# Patient Record
Sex: Female | Born: 1970 | Marital: Married | Smoking: Former smoker
Health system: Southern US, Community
[De-identification: ages and names within clinical notes are randomized; demographics above are authoritative.]

## PROBLEM LIST (undated history)

## (undated) DIAGNOSIS — E559 Vitamin D deficiency, unspecified: Secondary | ICD-10-CM

## (undated) DIAGNOSIS — R569 Unspecified convulsions: Secondary | ICD-10-CM

## (undated) DIAGNOSIS — L989 Disorder of the skin and subcutaneous tissue, unspecified: Secondary | ICD-10-CM

## (undated) DIAGNOSIS — N750 Cyst of Bartholin's gland: Secondary | ICD-10-CM

## (undated) DIAGNOSIS — L259 Unspecified contact dermatitis, unspecified cause: Secondary | ICD-10-CM

## (undated) DIAGNOSIS — Z309 Encounter for contraceptive management, unspecified: Secondary | ICD-10-CM

## (undated) HISTORY — DX: Unspecified convulsions: R56.9

## (undated) HISTORY — DX: Unspecified contact dermatitis, unspecified cause: L25.9

## (undated) HISTORY — DX: Disorder of the skin and subcutaneous tissue, unspecified: L98.9

## (undated) HISTORY — DX: Cyst of Bartholin's gland: N75.0

## (undated) HISTORY — PX: BREAST ENHANCEMENT SURGERY: SHX7

## (undated) HISTORY — DX: Vitamin D deficiency, unspecified: E55.9

## (undated) HISTORY — DX: Encounter for contraceptive management, unspecified: Z30.9

## (undated) HISTORY — PX: INTRAUTERINE DEVICE INSERTION: SHX323

---

## 2012-09-19 ENCOUNTER — Encounter: Payer: Self-pay | Admitting: Family Medicine

## 2012-09-19 ENCOUNTER — Ambulatory Visit (INDEPENDENT_AMBULATORY_CARE_PROVIDER_SITE_OTHER): Payer: 59 | Admitting: Family Medicine

## 2012-09-19 VITALS — BP 115/79 | HR 75 | Temp 98.0°F | Ht 69.0 in | Wt 130.8 lb

## 2012-09-19 DIAGNOSIS — Z0000 Encounter for general adult medical examination without abnormal findings: Secondary | ICD-10-CM

## 2012-09-19 DIAGNOSIS — Z309 Encounter for contraceptive management, unspecified: Secondary | ICD-10-CM

## 2012-09-19 DIAGNOSIS — E559 Vitamin D deficiency, unspecified: Secondary | ICD-10-CM | POA: Insufficient documentation

## 2012-09-19 DIAGNOSIS — Z23 Encounter for immunization: Secondary | ICD-10-CM

## 2012-09-19 DIAGNOSIS — L578 Other skin changes due to chronic exposure to nonionizing radiation: Secondary | ICD-10-CM

## 2012-09-19 DIAGNOSIS — R569 Unspecified convulsions: Secondary | ICD-10-CM

## 2012-09-19 DIAGNOSIS — L989 Disorder of the skin and subcutaneous tissue, unspecified: Secondary | ICD-10-CM

## 2012-09-19 HISTORY — DX: Disorder of the skin and subcutaneous tissue, unspecified: L98.9

## 2012-09-19 HISTORY — DX: Vitamin D deficiency, unspecified: E55.9

## 2012-09-19 HISTORY — DX: Encounter for contraceptive management, unspecified: Z30.9

## 2012-09-19 LAB — RENAL FUNCTION PANEL
Albumin: 3.9 g/dL (ref 3.5–5.2)
Chloride: 102 mEq/L (ref 96–112)
Phosphorus: 3.9 mg/dL (ref 2.3–4.6)
Potassium: 4.3 mEq/L (ref 3.5–5.1)

## 2012-09-19 NOTE — Assessment & Plan Note (Signed)
Have checked a level today. She takes a ca/vit d tab daily and a Vitamin D 1000IU cap daily as well.

## 2012-09-19 NOTE — Patient Instructions (Addendum)
Benign Rolandic Seizure Disorder   Preventive Care for Adults, Female A healthy lifestyle and preventive care can promote health and wellness. Preventive health guidelines for women include the following key practices.  A routine yearly physical is a good way to check with your caregiver about your health and preventive screening. It is a chance to share any concerns and updates on your health, and to receive a thorough exam.  Visit your dentist for a routine exam and preventive care every 6 months. Brush your teeth twice a day and floss once a day. Good oral hygiene prevents tooth decay and gum disease.  The frequency of eye exams is based on your age, health, family medical history, use of contact lenses, and other factors. Follow your caregiver's recommendations for frequency of eye exams.  Eat a healthy diet. Foods like vegetables, fruits, whole grains, low-fat dairy products, and lean protein foods contain the nutrients you need without too many calories. Decrease your intake of foods high in solid fats, added sugars, and salt. Eat the right amount of calories for you.Get information about a proper diet from your caregiver, if necessary.  Regular physical exercise is one of the most important things you can do for your health. Most adults should get at least 150 minutes of moderate-intensity exercise (any activity that increases your heart rate and causes you to sweat) each week. In addition, most adults need muscle-strengthening exercises on 2 or more days a week.  Maintain a healthy weight. The body mass index (BMI) is a screening tool to identify possible weight problems. It provides an estimate of body fat based on height and weight. Your caregiver can help determine your BMI, and can help you achieve or maintain a healthy weight.For adults 20 years and older:  A BMI below 18.5 is considered underweight.  A BMI of 18.5 to 24.9 is normal.  A BMI of 25 to 29.9 is considered  overweight.  A BMI of 30 and above is considered obese.  Maintain normal blood lipids and cholesterol levels by exercising and minimizing your intake of saturated fat. Eat a balanced diet with plenty of fruit and vegetables. Blood tests for lipids and cholesterol should begin at age 73 and be repeated every 5 years. If your lipid or cholesterol levels are high, you are over 50, or you are at high risk for heart disease, you may need your cholesterol levels checked more frequently.Ongoing high lipid and cholesterol levels should be treated with medicines if diet and exercise are not effective.  If you smoke, find out from your caregiver how to quit. If you do not use tobacco, do not start.  If you are pregnant, do not drink alcohol. If you are breastfeeding, be very cautious about drinking alcohol. If you are not pregnant and choose to drink alcohol, do not exceed 1 drink per day. One drink is considered to be 12 ounces (355 mL) of beer, 5 ounces (148 mL) of wine, or 1.5 ounces (44 mL) of liquor.  Avoid use of street drugs. Do not share needles with anyone. Ask for help if you need support or instructions about stopping the use of drugs.  High blood pressure causes heart disease and increases the risk of stroke. Your blood pressure should be checked at least every 1 to 2 years. Ongoing high blood pressure should be treated with medicines if weight loss and exercise are not effective.  If you are 28 to 39 years old, ask your caregiver if you should take  aspirin to prevent strokes.  Diabetes screening involves taking a blood sample to check your fasting blood sugar level. This should be done once every 3 years, after age 73, if you are within normal weight and without risk factors for diabetes. Testing should be considered at a younger age or be carried out more frequently if you are overweight and have at least 1 risk factor for diabetes.  Breast cancer screening is essential preventive care for  women. You should practice "breast self-awareness." This means understanding the normal appearance and feel of your breasts and may include breast self-examination. Any changes detected, no matter how small, should be reported to a caregiver. Women in their 4s and 30s should have a clinical breast exam (CBE) by a caregiver as part of a regular health exam every 1 to 3 years. After age 45, women should have a CBE every year. Starting at age 31, women should consider having a mammography (breast X-ray test) every year. Women who have a family history of breast cancer should talk to their caregiver about genetic screening. Women at a high risk of breast cancer should talk to their caregivers about having magnetic resonance imaging (MRI) and a mammography every year.  The Pap test is a screening test for cervical cancer. A Pap test can show cell changes on the cervix that might become cervical cancer if left untreated. A Pap test is a procedure in which cells are obtained and examined from the lower end of the uterus (cervix).  Women should have a Pap test starting at age 60.  Between ages 14 and 51, Pap tests should be repeated every 2 years.  Beginning at age 72, you should have a Pap test every 3 years as long as the past 3 Pap tests have been normal.  Some women have medical problems that increase the chance of getting cervical cancer. Talk to your caregiver about these problems. It is especially important to talk to your caregiver if a new problem develops soon after your last Pap test. In these cases, your caregiver may recommend more frequent screening and Pap tests.  The above recommendations are the same for women who have or have not gotten the vaccine for human papillomavirus (HPV).  If you had a hysterectomy for a problem that was not cancer or a condition that could lead to cancer, then you no longer need Pap tests. Even if you no longer need a Pap test, a regular exam is a good idea to make  sure no other problems are starting.  If you are between ages 50 and 23, and you have had normal Pap tests going back 10 years, you no longer need Pap tests. Even if you no longer need a Pap test, a regular exam is a good idea to make sure no other problems are starting.  If you have had past treatment for cervical cancer or a condition that could lead to cancer, you need Pap tests and screening for cancer for at least 20 years after your treatment.  If Pap tests have been discontinued, risk factors (such as a new sexual partner) need to be reassessed to determine if screening should be resumed.  The HPV test is an additional test that may be used for cervical cancer screening. The HPV test looks for the virus that can cause the cell changes on the cervix. The cells collected during the Pap test can be tested for HPV. The HPV test could be used to screen women aged  30 years and older, and should be used in women of any age who have unclear Pap test results. After the age of 46, women should have HPV testing at the same frequency as a Pap test.  Colorectal cancer can be detected and often prevented. Most routine colorectal cancer screening begins at the age of 48 and continues through age 38. However, your caregiver may recommend screening at an earlier age if you have risk factors for colon cancer. On a yearly basis, your caregiver may provide home test kits to check for hidden blood in the stool. Use of a small camera at the end of a tube, to directly examine the colon (sigmoidoscopy or colonoscopy), can detect the earliest forms of colorectal cancer. Talk to your caregiver about this at age 38, when routine screening begins. Direct examination of the colon should be repeated every 5 to 10 years through age 6, unless early forms of pre-cancerous polyps or small growths are found.  Hepatitis C blood testing is recommended for all people born from [birth year] through 1965 and any individual with known risks  for hepatitis C.  Practice safe sex. Use condoms and avoid high-risk sexual practices to reduce the spread of sexually transmitted infections (STIs). STIs include gonorrhea, chlamydia, syphilis, trichomonas, herpes, HPV, and human immunodeficiency virus (HIV). Herpes, HIV, and HPV are viral illnesses that have no cure. They can result in disability, cancer, and death. Sexually active women aged 27 and younger should be checked for chlamydia. Older women with new or multiple partners should also be tested for chlamydia. Testing for other STIs is recommended if you are sexually active and at increased risk.  Osteoporosis is a disease in which the bones lose minerals and strength with aging. This can result in serious bone fractures. The risk of osteoporosis can be identified using a bone density scan. Women ages 50 and over and women at risk for fractures or osteoporosis should discuss screening with their caregivers. Ask your caregiver whether you should take a calcium supplement or vitamin D to reduce the rate of osteoporosis.  Menopause can be associated with physical symptoms and risks. Hormone replacement therapy is available to decrease symptoms and risks. You should talk to your caregiver about whether hormone replacement therapy is right for you.  Use sunscreen with sun protection factor (SPF) of 30 or more. Apply sunscreen liberally and repeatedly throughout the day. You should seek shade when your shadow is shorter than you. Protect yourself by wearing long sleeves, pants, a wide-brimmed hat, and sunglasses year round, whenever you are outdoors.  Once a month, do a whole body skin exam, using a mirror to look at the skin on your back. Notify your caregiver of new moles, moles that have irregular borders, moles that are larger than a pencil eraser, or moles that have changed in shape or color.  Stay current with required immunizations.  Influenza. You need a dose every fall (or winter). The  composition of the flu vaccine changes each year, so being vaccinated once is not enough.  Pneumococcal polysaccharide. You need 1 to 2 doses if you smoke cigarettes or if you have certain chronic medical conditions. You need 1 dose at age 67 (or older) if you have never been vaccinated.  Tetanus, diphtheria, pertussis (Tdap, Td). Get 1 dose of Tdap vaccine if you are younger than age 83, are over 60 and have contact with an infant, are a Research scientist (physical sciences), are pregnant, or simply want to be protected from whooping cough.  After that, you need a Td booster dose every 10 years. Consult your caregiver if you have not had at least 3 tetanus and diphtheria-containing shots sometime in your life or have a deep or dirty wound.  HPV. You need this vaccine if you are a woman age 42 or younger. The vaccine is given in 3 doses over 6 months.  Measles, mumps, rubella (MMR). You need at least 1 dose of MMR if you were born in 1957 or later. You may also need a second dose.  Meningococcal. If you are age 64 to 8 and a first-year college student living in a residence hall, or have one of several medical conditions, you need to get vaccinated against meningococcal disease. You may also need additional booster doses.  Zoster (shingles). If you are age 66 or older, you should get this vaccine.  Varicella (chickenpox). If you have never had chickenpox or you were vaccinated but received only 1 dose, talk to your caregiver to find out if you need this vaccine.  Hepatitis A. You need this vaccine if you have a specific risk factor for hepatitis A virus infection or you simply wish to be protected from this disease. The vaccine is usually given as 2 doses, 6 to 18 months apart.  Hepatitis B. You need this vaccine if you have a specific risk factor for hepatitis B virus infection or you simply wish to be protected from this disease. The vaccine is given in 3 doses, usually over 6 months. Preventive Services /  Frequency Ages 1 to 34  Blood pressure check.** / Every 1 to 2 years.  Lipid and cholesterol check.** / Every 5 years beginning at age 75.  Clinical breast exam.** / Every 3 years for women in their 56s and 30s.  Pap test.** / Every 2 years from ages 77 through 49. Every 3 years starting at age 90 through age 5 or 69 with a history of 3 consecutive normal Pap tests.  HPV screening.** / Every 3 years from ages 81 through ages 59 to 43 with a history of 3 consecutive normal Pap tests.  Hepatitis C blood test.** / For any individual with known risks for hepatitis C.  Skin self-exam. / Monthly.  Influenza immunization.** / Every year.  Pneumococcal polysaccharide immunization.** / 1 to 2 doses if you smoke cigarettes or if you have certain chronic medical conditions.  Tetanus, diphtheria, pertussis (Tdap, Td) immunization. / A one-time dose of Tdap vaccine. After that, you need a Td booster dose every 10 years.  HPV immunization. / 3 doses over 6 months, if you are 38 and younger.  Measles, mumps, rubella (MMR) immunization. / You need at least 1 dose of MMR if you were born in 1957 or later. You may also need a second dose.  Meningococcal immunization. / 1 dose if you are age 49 to 73 and a first-year college student living in a residence hall, or have one of several medical conditions, you need to get vaccinated against meningococcal disease. You may also need additional booster doses.  Varicella immunization.** / Consult your caregiver.  Hepatitis A immunization.** / Consult your caregiver. 2 doses, 6 to 18 months apart.  Hepatitis B immunization.** / Consult your caregiver. 3 doses usually over 6 months. Ages 45 to 56  Blood pressure check.** / Every 1 to 2 years.  Lipid and cholesterol check.** / Every 5 years beginning at age 58.  Clinical breast exam.** / Every year after age 16.  Mammogram.** /  Every year beginning at age 43 and continuing for as long as you are in  good health. Consult with your caregiver.  Pap test.** / Every 3 years starting at age 44 through age 61 or 25 with a history of 3 consecutive normal Pap tests.  HPV screening.** / Every 3 years from ages 58 through ages 78 to 73 with a history of 3 consecutive normal Pap tests.  Fecal occult blood test (FOBT) of stool. / Every year beginning at age 19 and continuing until age 78. You may not need to do this test if you get a colonoscopy every 10 years.  Flexible sigmoidoscopy or colonoscopy.** / Every 5 years for a flexible sigmoidoscopy or every 10 years for a colonoscopy beginning at age 43 and continuing until age 68.  Hepatitis C blood test.** / For all people born from [birth year] through 1965 and any individual with known risks for hepatitis C.  Skin self-exam. / Monthly.  Influenza immunization.** / Every year.  Pneumococcal polysaccharide immunization.** / 1 to 2 doses if you smoke cigarettes or if you have certain chronic medical conditions.  Tetanus, diphtheria, pertussis (Tdap, Td) immunization.** / A one-time dose of Tdap vaccine. After that, you need a Td booster dose every 10 years.  Measles, mumps, rubella (MMR) immunization. / You need at least 1 dose of MMR if you were born in 1957 or later. You may also need a second dose.  Varicella immunization.** / Consult your caregiver.  Meningococcal immunization.** / Consult your caregiver.  Hepatitis A immunization.** / Consult your caregiver. 2 doses, 6 to 18 months apart.  Hepatitis B immunization.** / Consult your caregiver. 3 doses, usually over 6 months. Ages 79 and over  Blood pressure check.** / Every 1 to 2 years.  Lipid and cholesterol check.** / Every 5 years beginning at age 49.  Clinical breast exam.** / Every year after age 24.  Mammogram.** / Every year beginning at age 38 and continuing for as long as you are in good health. Consult with your caregiver.  Pap test.** / Every 3 years starting at age 57 through  age 42 or 73 with a 3 consecutive normal Pap tests. Testing can be stopped between 65 and 70 with 3 consecutive normal Pap tests and no abnormal Pap or HPV tests in the past 10 years.  HPV screening.** / Every 3 years from ages 22 through ages 66 or 29 with a history of 3 consecutive normal Pap tests. Testing can be stopped between 65 and 70 with 3 consecutive normal Pap tests and no abnormal Pap or HPV tests in the past 10 years.  Fecal occult blood test (FOBT) of stool. / Every year beginning at age 20 and continuing until age 56. You may not need to do this test if you get a colonoscopy every 10 years.  Flexible sigmoidoscopy or colonoscopy.** / Every 5 years for a flexible sigmoidoscopy or every 10 years for a colonoscopy beginning at age 60 and continuing until age 45.  Hepatitis C blood test.** / For all people born from [birth year] through 1965 and any individual with known risks for hepatitis C.  Osteoporosis screening.** / A one-time screening for women ages 35 and over and women at risk for fractures or osteoporosis.  Skin self-exam. / Monthly.  Influenza immunization.** / Every year.  Pneumococcal polysaccharide immunization.** / 1 dose at age 37 (or older) if you have never been vaccinated.  Tetanus, diphtheria, pertussis (Tdap, Td) immunization. / A one-time dose of  Tdap vaccine if you are over 65 and have contact with an infant, are a Research scientist (physical sciences), or simply want to be protected from whooping cough. After that, you need a Td booster dose every 10 years.  Varicella immunization.** / Consult your caregiver.  Meningococcal immunization.** / Consult your caregiver.  Hepatitis A immunization.** / Consult your caregiver. 2 doses, 6 to 18 months apart.  Hepatitis B immunization.** / Check with your caregiver. 3 doses, usually over 6 months. ** Family history and personal history of risk and conditions may change your caregiver's recommendations. Document Released: 01/16/2002 Document  Revised: 02/12/2012 Document Reviewed: 04/17/2011 [employer] Patient Information 2013 [place], [place].

## 2012-09-19 NOTE — Progress Notes (Signed)
Patient ID: Sharon Rojas, female   DOB: [birth year], 41 y.o.   MRN: [id number] Sharon Rojas [id number] 03-02-1971 09/19/2012      Progress Note New Patient  Subjective  Chief Complaint  Chief Complaint  Patient presents with  . Establish Care    new patient    HPI  41 year old Caucasian female who is in today for her new patient appointment. She's recently moved here from [place] for her husband stopped. She is generally in good health and just needs to establish care. She does have a history of a seizure disorder which started at age 74. He started not currently and she takes several medications which make him infrequent. She is otherwise generally in good health. She notes some mild URI symptoms for last day or 2 for which she took some Alka-Seltzer and was able to attain some improvement. She has some mild nasal congestion but she denies fevers or chills. No ear pain or throat pain , cough, chest pain, palpitations, shortness of breath, GI or GU complaints.  Past Medical History  Diagnosis Date  . Seizures 41 yrs old  . Chicken pox as a child  . Preventative health care 09/19/2012  . Contraceptive management 09/19/2012  . Vitamin D deficiency 09/19/2012  . Skin lesion of cheek 09/19/2012    Past Surgical History  Procedure Date  . Breast surgery 41 yrs old    breast implants    Family History  Problem Relation Age of Onset  . Heart attack Maternal Grandfather   . Heart disease Maternal Grandfather   . Asthma Paternal Grandmother   . Arthritis Paternal Grandmother   . Cancer Paternal Grandfather     lung- smoked  . Alcohol abuse Paternal Grandfather   . Alcohol abuse Father     History   Social History  . Marital Status: Married    Spouse Name: N/A    Number of Children: N/A  . Years of Education: N/A   Occupational History  . Not on file.   Social History Main Topics  . Smoking status: Former Smoker -- 5 years    Types: Cigarettes    Start date:  12/04/1997  . Smokeless tobacco: Never Used  . Alcohol Use: Yes     occasionally  . Drug Use: No  . Sexually Active: Yes -- Female partner(s)   Other Topics Concern  . Not on file   Social History Narrative  . No narrative on file    Current Outpatient Prescriptions on File Prior to Visit  Medication Sig Dispense Refill  . carbamazepine (CARBATROL) 300 MG 12 hr capsule Take 300 mg by mouth as directed. Take 1 capsule in the morning and 2 capsules at night      . clonazePAM (KLONOPIN) 1 MG tablet Take 1 mg by mouth at bedtime.      Marland Kitchen lacosamide (VIMPAT) 200 MG TABS Take 200 mg by mouth 2 (two) times daily.      Marland Kitchen levETIRAcetam (KEPPRA) 750 MG tablet Take 1,500 mg by mouth 2 (two) times daily.      Marland Kitchen levonorgestrel (MIRENA) 20 MCG/24HR IUD 1 each by Intrauterine route once.        No Known Allergies  Review of Systems  Review of Systems  Constitutional: Negative for fever, chills and malaise/fatigue.  HENT: Positive for congestion. Negative for hearing loss and nosebleeds.   Eyes: Negative for discharge.  Respiratory: Negative for cough, sputum production, shortness of breath and wheezing.   Cardiovascular: Negative for chest  pain, palpitations and leg swelling.  Gastrointestinal: Negative for heartburn, nausea, vomiting, abdominal pain, diarrhea, constipation and blood in stool.  Genitourinary: Negative for dysuria, urgency, frequency and hematuria.  Musculoskeletal: Negative for myalgias, back pain and falls.  Skin: Negative for rash.  Neurological: Negative for dizziness, tremors, sensory change, focal weakness, loss of consciousness, weakness and headaches.  Endo/Heme/Allergies: Negative for polydipsia. Does not bruise/bleed easily.  Psychiatric/Behavioral: Negative for depression and suicidal ideas. The patient is not nervous/anxious and does not have insomnia.     Objective  BP 115/79  Pulse 75  Temp 98 F (36.7 C) (Temporal)  Ht 5\' 9"  (1.753 m)  Wt 130 lb 12.8 oz  (59.33 kg)  BMI 19.32 kg/m2  SpO2 99%  Physical Exam  Physical Exam  Constitutional: She is oriented to person, place, and time and well-developed, well-nourished, and in no distress. No distress.  HENT:  Head: Normocephalic and atraumatic.  Right Ear: External ear normal.  Left Ear: External ear normal.  Nose: Nose normal.  Mouth/Throat: Oropharynx is clear and moist. No oropharyngeal exudate.  Eyes: Conjunctivae normal are normal. Pupils are equal, round, and reactive to light. Right eye exhibits no discharge. Left eye exhibits no discharge. No scleral icterus.  Neck: Normal range of motion. Neck supple. No thyromegaly present.  Cardiovascular: Normal rate, regular rhythm, normal heart sounds and intact distal pulses.   No murmur heard. Pulmonary/Chest: Effort normal and breath sounds normal. No respiratory distress. She has no wheezes. She has no rales.  Abdominal: Soft. Bowel sounds are normal. She exhibits no distension and no mass. There is no tenderness.  Musculoskeletal: Normal range of motion. She exhibits no edema and no tenderness.  Lymphadenopathy:    She has no cervical adenopathy.  Neurological: She is alert and oriented to person, place, and time. She has normal reflexes. No cranial nerve deficit. Coordination normal.  Skin: Skin is warm and dry. No rash noted. She is not diaphoretic. There is erythema.       Diffuse moles of different sizes throughout. 1 cm raised circular, erythematous lesion on right cheek  Psychiatric: Mood, memory and affect normal.       Assessment & Plan  Seizures Started having seizures at age 32, has them late night or early morning. Has them infrequently. 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 Will continue to follow with her [place] neurologist for now since she is stable and she goes back to [place] twice annually still  Vitamin D deficiency Have checked a level today. She takes a ca/vit d tab daily and a Vitamin D 1000IU cap daily as  well.   Skin lesion of cheek Has had it for years but lately it as become increasingly irritated and inflamed. Will refer to dermatology for further evaluation. She has a history of significant sun exposure and moles.   Contraceptive management Has Mirena IUD in place has had it 4 years now.  Preventative health care Will request old records and have her return annually for follow up, will review labs done previously by her Neurologist before ordering any further labs.

## 2012-09-19 NOTE — Assessment & Plan Note (Signed)
Started having seizures at age 41, has them late night or early morning. Has them infrequently. 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 Will continue to follow with her [place] neurologist for now since she is stable and she goes back to [place] twice annually still

## 2012-09-19 NOTE — Assessment & Plan Note (Signed)
Has had it for years but lately it as become increasingly irritated and inflamed. Will refer to dermatology for further evaluation. She has a history of significant sun exposure and moles.

## 2012-09-19 NOTE — Assessment & Plan Note (Signed)
Will request old records and have her return annually for follow up, will review labs done previously by her Neurologist before ordering any further labs.

## 2012-09-19 NOTE — Assessment & Plan Note (Signed)
Has Mirena IUD in place has had it 4 years now.

## 2012-09-23 LAB — VITAMIN D 1,25 DIHYDROXY: Vitamin D3 1, 25 (OH)2: 66 pg/mL

## 2012-12-31 ENCOUNTER — Telehealth: Payer: Self-pay | Admitting: Family Medicine

## 2012-12-31 NOTE — Telephone Encounter (Signed)
So my daughter has Benign Rolandic Seizure Disorder. [place] was in [place] so my neurologist is too far away. I do not remember who her daughter sees but I think there is a pediatric neurologist in [place] everyone likes, Dr Benna Dunks or Sharene Skeans (can check with steph or Dr Reece Agar) and see if they recall the name or she could use [facility]

## 2012-12-31 NOTE — Telephone Encounter (Signed)
Got it, I remember now, she has just had trouble since she was young. So neurology for adults includes [facility] in [place] and [facility] in [place]. Once she lets [place] know where she wants to go, we can offer a few names. I do not know the individuals well but know the names somewhat better in [facility]

## 2012-12-31 NOTE — Telephone Encounter (Signed)
Please advise 

## 2012-12-31 NOTE — Telephone Encounter (Signed)
Patient mentioned that Dr. Mariel Aloe daughter has the same medical condition. Can Dr. Abner Greenspan recommend a neurologist?

## 2012-12-31 NOTE — Telephone Encounter (Signed)
Patient informed and voiced understanding

## 2012-12-31 NOTE — Telephone Encounter (Signed)
I read message to patient and she states she needs the appointment not a child?

## 2013-09-19 ENCOUNTER — Other Ambulatory Visit: Payer: 59

## 2013-09-26 ENCOUNTER — Encounter: Payer: 59 | Admitting: Family Medicine

## 2013-11-21 ENCOUNTER — Emergency Department (HOSPITAL_COMMUNITY)
Admission: EM | Admit: 2013-11-21 | Discharge: 2013-11-21 | Disposition: A | Discharge status: Home / Self Care | Payer: 59 | Attending: Emergency Medicine | Admitting: Emergency Medicine

## 2013-11-21 ENCOUNTER — Emergency Department (HOSPITAL_COMMUNITY): Payer: 59

## 2013-11-21 ENCOUNTER — Encounter (HOSPITAL_COMMUNITY): Payer: Self-pay | Admitting: Emergency Medicine

## 2013-11-21 DIAGNOSIS — Z975 Presence of (intrauterine) contraceptive device: Secondary | ICD-10-CM | POA: Insufficient documentation

## 2013-11-21 DIAGNOSIS — Z8619 Personal history of other infectious and parasitic diseases: Secondary | ICD-10-CM | POA: Insufficient documentation

## 2013-11-21 DIAGNOSIS — Z872 Personal history of diseases of the skin and subcutaneous tissue: Secondary | ICD-10-CM | POA: Insufficient documentation

## 2013-11-21 DIAGNOSIS — G40909 Epilepsy, unspecified, not intractable, without status epilepticus: Secondary | ICD-10-CM | POA: Insufficient documentation

## 2013-11-21 DIAGNOSIS — R569 Unspecified convulsions: Secondary | ICD-10-CM

## 2013-11-21 DIAGNOSIS — Z8639 Personal history of other endocrine, nutritional and metabolic disease: Secondary | ICD-10-CM | POA: Insufficient documentation

## 2013-11-21 DIAGNOSIS — J111 Influenza due to unidentified influenza virus with other respiratory manifestations: Secondary | ICD-10-CM | POA: Insufficient documentation

## 2013-11-21 DIAGNOSIS — Z79899 Other long term (current) drug therapy: Secondary | ICD-10-CM | POA: Insufficient documentation

## 2013-11-21 DIAGNOSIS — R6883 Chills (without fever): Secondary | ICD-10-CM | POA: Insufficient documentation

## 2013-11-21 DIAGNOSIS — Z3202 Encounter for pregnancy test, result negative: Secondary | ICD-10-CM | POA: Insufficient documentation

## 2013-11-21 DIAGNOSIS — Z87891 Personal history of nicotine dependence: Secondary | ICD-10-CM | POA: Insufficient documentation

## 2013-11-21 LAB — BASIC METABOLIC PANEL
BUN: 11 mg/dL (ref 6–23)
Creatinine, Ser: 0.58 mg/dL (ref 0.50–1.10)
GFR calc Af Amer: >90 mL/min (ref >90)
GFR calc non Af Amer: >90 mL/min (ref >90)

## 2013-11-21 LAB — URINALYSIS, ROUTINE W REFLEX MICROSCOPIC
Bilirubin Urine: NEGATIVE
Ketones, ur: NEGATIVE mg/dL
Leukocytes, UA: NEGATIVE
Nitrite: NEGATIVE
Protein, ur: 30 mg/dL — AB
Urobilinogen, UA: 0.2 mg/dL (ref 0.0–1.0)

## 2013-11-21 LAB — CBC
HCT: 39.6 % (ref 36.0–46.0)
MCH: 30.7 pg (ref 26.0–34.0)
MCHC: 34.3 g/dL (ref 30.0–36.0)
MCV: 89.4 fL (ref 78.0–100.0)
Platelets: 192 10*3/uL (ref 150–400)
RDW: 12.3 % (ref 11.5–15.5)

## 2013-11-21 LAB — POCT PREGNANCY, URINE: Preg Test, Ur: NEGATIVE

## 2013-11-21 MED ORDER — KETOROLAC TROMETHAMINE 30 MG/ML IJ SOLN
30.0000 mg | Freq: Once | INTRAMUSCULAR | Status: DC
  Filled 2013-11-21: qty 1

## 2013-11-21 MED ORDER — DIPHENHYDRAMINE HCL 25 MG PO CAPS
50.0000 mg | ORAL_CAPSULE | Freq: Once | ORAL | Status: AC
  Administered 2013-11-21: 50 mg via ORAL
  Filled 2013-11-21: qty 2

## 2013-11-21 MED ORDER — SODIUM CHLORIDE 0.9 % IV BOLUS (SEPSIS): 1000.0000 mL | Freq: Once | INTRAVENOUS | Status: DC

## 2013-11-21 MED ORDER — ZOLPIDEM TARTRATE 5 MG PO TABS: 5.0000 mg | ORAL_TABLET | Freq: Every evening | ORAL | Status: DC | PRN

## 2013-11-21 NOTE — ED Notes (Signed)
Per EMS pt reports hx of seizures, usually during 2nd stage of sleep, however since last night she reports she had 15 seizures, also when awake which she sts is unusual fro her.Pt also reports today suddenly she developed flu like symptoms with n/v. EMS reports pt appears very anxious.

## 2013-11-21 NOTE — ED Notes (Signed)
Bed: WA06 Expected date:  Expected time:  Means of arrival:  Comments: EMS-cold symptoms/SZ

## 2013-11-21 NOTE — ED Provider Notes (Signed)
CSN: [id number]     Arrival date & time 11/21/13  1814 History   First MD Initiated Contact with Patient 11/21/13 1822     Chief Complaint  Patient presents with  . Seizures   (Consider location/radiation/quality/duration/timing/severity/associated sxs/prior Treatment) Patient is a 42 y.o. female presenting with seizures. The history is provided by the patient.  Seizures Seizure activity on arrival: no   Seizure type:  Petit mal Preceding symptoms: no sensation of an aura present, no dizziness and no nausea   Initial focality:  None Episode characteristics: generalized shaking and unresponsiveness   Postictal symptoms: no memory loss   Return to baseline: yes   Severity:  Moderate Timing:  Clustered Number of seizures this episode:  >15 Progression:  Worsening Context: decreased sleep   Context: not alcohol withdrawal, not cerebral palsy, not fever and not stress   Recent head injury:  No recent head injuries PTA treatment:  None History of seizures: yes     Past Medical History  Diagnosis Date  . Seizures 42 yrs old  . Chicken pox as a child  . Preventative health care 09/19/2012  . Contraceptive management 09/19/2012  . Vitamin D deficiency 09/19/2012  . Skin lesion of cheek 09/19/2012   Past Surgical History  Procedure Laterality Date  . Breast surgery  42 yrs old    breast implants   Family History  Problem Relation Age of Onset  . Heart attack Maternal Grandfather   . Heart disease Maternal Grandfather   . Asthma Paternal Grandmother   . Arthritis Paternal Grandmother   . Cancer Paternal Grandfather     lung- smoked  . Alcohol abuse Paternal Grandfather   . Alcohol abuse Father    History  Substance Use Topics  . Smoking status: Former Smoker -- 5 years    Types: Cigarettes    Start date: 12/04/1997  . Smokeless tobacco: Never Used  . Alcohol Use: Yes     Comment: occasionally   OB History   Grav Para Term Preterm Abortions TAB SAB Ect Mult Living                  Review of Systems  Constitutional: Positive for chills. Negative for fever.  Respiratory: Negative for cough and shortness of breath.   Neurological: Positive for seizures.  All other systems reviewed and are negative.    Allergies  Review of patient's allergies indicates no known allergies.  Home Medications   Current Outpatient Rx  Name  Route  Sig  Dispense  Refill  . CALCIUM PO   Oral   Take 1 tablet by mouth daily.         . carbamazepine (CARBATROL) 300 MG 12 hr capsule   Oral   Take 300 mg by mouth as directed. Take 1 capsule in the morning and 2 capsules at night         . cholecalciferol (VITAMIN D) 1000 UNITS tablet   Oral   Take 1,000 Units by mouth daily.         . clonazePAM (KLONOPIN) 1 MG tablet   Oral   Take 1 mg by mouth at bedtime.         . cycloSPORINE (RESTASIS) 0.05 % ophthalmic emulsion   Both Eyes   Place 1 drop into both eyes 2 (two) times daily.         Marland Kitchen lacosamide (VIMPAT) 200 MG TABS   Oral   Take 200 mg by mouth 2 (two) times daily.         Marland Kitchen  levETIRAcetam (KEPPRA) 1000 MG tablet   Oral   Take 1,000 mg by mouth 2 (two) times daily.         Marland Kitchen levonorgestrel (MIRENA) 20 MCG/24HR IUD   Intrauterine   1 each by Intrauterine route once.         . Multiple Vitamin (MULTIVITAMIN WITH MINERALS) TABS tablet   Oral   Take 1 tablet by mouth daily.          BP 102/66  Pulse 82  Temp(Src) 98 F (36.7 C) (Oral)  Resp 20  SpO2 98% Physical Exam  Nursing note and vitals reviewed. Constitutional: She is oriented to person, place, and time. She appears well-developed and well-nourished. No distress.  HENT:  Head: Normocephalic and atraumatic.  Eyes: EOM are normal. Pupils are equal, round, and reactive to light.  Neck: Normal range of motion. Neck supple.  Cardiovascular: Normal rate and regular rhythm.  Exam reveals no friction rub.   No murmur heard. Pulmonary/Chest: Effort normal and breath sounds  normal. No respiratory distress. She has no wheezes. She has no rales.  Abdominal: Soft. She exhibits no distension. There is no tenderness. There is no rebound.  Musculoskeletal: Normal range of motion. She exhibits no edema.  Neurological: She is alert and oriented to person, place, and time.  Skin: She is not diaphoretic.    ED Course  Procedures (including critical care time) Labs Review Labs Reviewed  CBC - Abnormal; Notable for the following:    WBC 3.7 (*)    All other components within normal limits  BASIC METABOLIC PANEL - Abnormal; Notable for the following:    Sodium 133 (*)    Calcium 8.3 (*)    All other components within normal limits  URINALYSIS, ROUTINE W REFLEX MICROSCOPIC - Abnormal; Notable for the following:    APPearance CLOUDY (*)    Protein, ur 30 (*)    All other components within normal limits  URINE MICROSCOPIC-ADD ON - Abnormal; Notable for the following:    Squamous Epithelial / LPF MANY (*)    Bacteria, UA FEW (*)    All other components within normal limits  POCT PREGNANCY, URINE   Imaging Review Dg Chest 2 View  11/21/2013   CLINICAL DATA:  Chills, dehydration  EXAM: CHEST - 2 VIEW  COMPARISON:  None available  FINDINGS: The heart size and mediastinal contours are within normal limits. Both lungs are clear. Mild thoracic levoscoliosis without evident underlying vertebral anomaly. No effusion.  IMPRESSION: No acute cardiopulmonary disease.   Electronically Signed   By: Oley Balm M.D.   On: 11/21/2013 19:07    EKG Interpretation   None       MDM   1. Seizures   2. Influenza-like illness    42 year old female with history of seizures presents with increasing seizures. She believes it secondary to a influenza-like illness she had since last night. That illness is characterized by nausea, vomiting, chills, myalgias. Her daughters also been sick. She has a history of seizures for the past 33 years. She's on multiple antiepileptics and has  not missed any doses. She has not slept much lately due to her illness, and this is why she normally has increasing seizures. Procedures always happen during her second stage of sleep and she's waking up. She has a roughly everyday. Today when napping she had multiple seizures back to back and she was concerned. She thinks she just needs to get rehydrated help her feel better and to stop  her seizure activity from happening so frequently. Patient seizures are described as a unresponsiveness without generalized loss of consciousness. She does have some generalized shaking. She was not postictal upon arrival and is neurologically intact here. We'll check labs give fluids and give nausea medications Labs normal. Feeling better after fluids. Doesn't want another liter or toradol. States wants something to sleep to help her get through her illness. Instructed to use benadryl, benadryl given here. Stable for discharge.   Dagmar Hait, MD 11/21/13 [phone]

## 2013-11-21 NOTE — ED Notes (Signed)
Pt declined IVF and Toradol.  Pt reports not in pain so she doesn't need it.  MD notified

## 2014-06-01 ENCOUNTER — Encounter: Payer: Self-pay | Admitting: Family Medicine

## 2014-06-01 ENCOUNTER — Ambulatory Visit (INDEPENDENT_AMBULATORY_CARE_PROVIDER_SITE_OTHER): Payer: 59 | Admitting: Family Medicine

## 2014-06-01 VITALS — BP 102/68 | HR 72 | Temp 98.0°F | Resp 16 | Ht 69.0 in | Wt 136.0 lb

## 2014-06-01 DIAGNOSIS — H01009 Unspecified blepharitis unspecified eye, unspecified eyelid: Secondary | ICD-10-CM

## 2014-06-01 DIAGNOSIS — H01003 Unspecified blepharitis right eye, unspecified eyelid: Secondary | ICD-10-CM | POA: Insufficient documentation

## 2014-06-01 DIAGNOSIS — H01006 Unspecified blepharitis left eye, unspecified eyelid: Principal | ICD-10-CM

## 2014-06-01 MED ORDER — ERYTHROMYCIN 5 MG/GM OP OINT: TOPICAL_OINTMENT | OPHTHALMIC | Status: DC

## 2014-06-01 NOTE — Progress Notes (Signed)
OFFICE NOTE  06/01/2014  CC:  Chief Complaint  Patient presents with  . Belepharitis   HPI: Patient is a 43 y.o. Caucasian female who is here for eye complaints. Itchy eyes x 2d, have been swelling some on upper lids, some flaking of skin.  No lash changes noted.  No eye mucous/drainage noted.  No sneezing/allergic rhinitis sx's.  No new make-up or topicals applied.  Pertinent PMH:  Past medical, surgical hx reviewed.  MEDS:  Outpatient Prescriptions Prior to Visit  Medication Sig Dispense Refill  . CALCIUM PO Take 1 tablet by mouth daily.      . carbamazepine (CARBATROL) 300 MG 12 hr capsule Take 300 mg by mouth as directed. Take 1 capsule in the morning and 2 capsules at night      . cholecalciferol (VITAMIN D) 1000 UNITS tablet Take 1,000 Units by mouth daily.      . clonazePAM (KLONOPIN) 1 MG tablet Take 1 mg by mouth at bedtime.      . cycloSPORINE (RESTASIS) 0.05 % ophthalmic emulsion Place 1 drop into both eyes 2 (two) times daily.      Marland Kitchen. lacosamide (VIMPAT) 200 MG TABS Take 200 mg by mouth 2 (two) times daily.      Marland Kitchen. levETIRAcetam (KEPPRA) 1000 MG tablet Take 1,000 mg by mouth 2 (two) times daily.      Marland Kitchen. levonorgestrel (MIRENA) 20 MCG/24HR IUD 1 each by Intrauterine route once.      . Multiple Vitamin (MULTIVITAMIN WITH MINERALS) TABS tablet Take 1 tablet by mouth daily.       No facility-administered medications prior to visit.    PE: Height 5\' 9"  (1.753 m), weight 136 lb (61.689 kg), last menstrual period 05/25/2014. Gen: Alert, well appearing.  Patient is oriented to person, place, time, and situation. PERRLA, EOMI.  No bulbar conjunctival injection, no lower lid swelling or inflammation.   Upper eyelids with diffuse mild swelling and deep pinkish hue, palb conj mildly injected underneath upper lids.  No palpebral mass, lashes appear normal.  No exudate.  IMPRESSION AND PLAN:  Blepharitis, suspect staph. E-mycin ointment qid x 7d each eye. J&J shampoo warm soaks  qd.  An After Visit Summary was printed and given to the patient.  FOLLOW UP: prn

## 2014-06-01 NOTE — Progress Notes (Signed)
Pre visit review using our clinic review tool, if applicable. No additional management support is needed unless otherwise documented below in the visit note. 

## 2014-06-03 DIAGNOSIS — L259 Unspecified contact dermatitis, unspecified cause: Secondary | ICD-10-CM

## 2014-06-03 HISTORY — DX: Unspecified contact dermatitis, unspecified cause: L25.9

## 2014-06-09 ENCOUNTER — Telehealth: Payer: Self-pay

## 2014-06-09 DIAGNOSIS — H01006 Unspecified blepharitis left eye, unspecified eyelid: Principal | ICD-10-CM

## 2014-06-09 DIAGNOSIS — H01003 Unspecified blepharitis right eye, unspecified eyelid: Secondary | ICD-10-CM

## 2014-06-09 NOTE — Telephone Encounter (Signed)
Pt called stating that her eye is not better. She has been using the drops for the last 7 days. Her eye is still red and itchy but the inflammation is gone. She wants to know if she has to RTC or should she see her eye doctor today. Please advise.

## 2014-06-09 NOTE — Telephone Encounter (Signed)
I recommend she see her eye MD today.

## 2014-06-09 NOTE — Telephone Encounter (Signed)
Pt called and stated her eye doctor is out of town until next week. Can we refer her somewhere else in town? [facility]. Please advise.

## 2014-06-09 NOTE — Telephone Encounter (Signed)
Called pt, advised she should see her eye doctor. Pt agreed and will try to get in today.

## 2014-06-11 ENCOUNTER — Encounter: Payer: Self-pay | Admitting: Family Medicine

## 2014-10-01 ENCOUNTER — Ambulatory Visit (INDEPENDENT_AMBULATORY_CARE_PROVIDER_SITE_OTHER): Payer: 59

## 2014-10-01 DIAGNOSIS — Z23 Encounter for immunization: Secondary | ICD-10-CM

## 2015-01-06 ENCOUNTER — Encounter: Payer: Self-pay | Admitting: Family Medicine

## 2015-01-28 ENCOUNTER — Encounter: Payer: Self-pay | Admitting: Family Medicine

## 2015-02-15 ENCOUNTER — Ambulatory Visit: Payer: 59 | Admitting: Family Medicine

## 2015-02-16 ENCOUNTER — Encounter: Payer: Self-pay | Admitting: Family Medicine

## 2015-02-16 ENCOUNTER — Ambulatory Visit (INDEPENDENT_AMBULATORY_CARE_PROVIDER_SITE_OTHER): Payer: 59 | Admitting: Family Medicine

## 2015-02-16 VITALS — BP 111/74 | HR 75 | Temp 99.8°F | Resp 18 | Ht 69.0 in | Wt 140.0 lb

## 2015-02-16 DIAGNOSIS — J069 Acute upper respiratory infection, unspecified: Secondary | ICD-10-CM

## 2015-02-16 NOTE — Progress Notes (Signed)
OFFICE NOTE  02/16/2015  CC:  Chief Complaint  Patient presents with  . Sore Throat  . Ear Pain   HPI: Patient is a 44 y.o. Caucasian female who is here for recent ST/ear pain.  Pertinent PMH:  Past medical, surgical, social, and family history reviewed and no changes are noted since last office visit.  MEDS:  Outpatient Prescriptions Prior to Visit  Medication Sig Dispense Refill  . CALCIUM PO Take 1 tablet by mouth daily.    . carbamazepine (CARBATROL) 300 MG 12 hr capsule Take 300 mg by mouth as directed. Take 1 capsule in the morning and 2 capsules at night    . cholecalciferol (VITAMIN D) 1000 UNITS tablet Take 1,000 Units by mouth daily.    . clonazePAM (KLONOPIN) 1 MG tablet Take 1 mg by mouth at bedtime.    . cycloSPORINE (RESTASIS) 0.05 % ophthalmic emulsion Place 1 drop into both eyes 2 (two) times daily.    Marland Kitchen. erythromycin ophthalmic ointment 1/2 inch ribbon of ointment in each eye qid x 7d 3.5 g 1  . lacosamide (VIMPAT) 200 MG TABS Take 200 mg by mouth 2 (two) times daily.    Marland Kitchen. levETIRAcetam (KEPPRA) 1000 MG tablet Take 1,000 mg by mouth 2 (two) times daily.    Marland Kitchen. levonorgestrel (MIRENA) 20 MCG/24HR IUD 1 each by Intrauterine route once.    . Multiple Vitamin (MULTIVITAMIN WITH MINERALS) TABS tablet Take 1 tablet by mouth daily.     No facility-administered medications prior to visit.    PE: Blood pressure 111/74, pulse 75, temperature 99.8 F (37.7 C), temperature source Temporal, resp. rate 18, height 5\' 9"  (1.753 m), weight 140 lb (63.504 kg), SpO2 100 %. VS: noted--normal. Gen: alert, NAD, NONTOXIC APPEARING. HEENT: eyes without injection, drainage, or swelling.  Ears: EACs clear, TMs with normal light reflex and landmarks.  Nose: Clear rhinorrhea, with some dried, crusty exudate adherent to mildly injected mucosa.  No purulent d/c.  No paranasal sinus TTP.  No facial swelling.  Throat and mouth without focal lesion.  No pharyngial swelling, erythema, or exudate.    Neck: supple, no LAD.   LUNGS: CTA bilat, nonlabored resps.   CV: RRR, no m/r/g. EXT: no c/c/e SKIN: no rash    IMPRESSION AND PLAN:  Viral URI, resolving. Symptomatic care discussed.  An After Visit Summary was printed and given to the patient.  FOLLOW UP: prn

## 2015-02-16 NOTE — Progress Notes (Signed)
Pre visit review using our clinic review tool, if applicable. No additional management support is needed unless otherwise documented below in the visit note. 

## 2015-02-17 ENCOUNTER — Ambulatory Visit: Payer: 59 | Admitting: Family Medicine

## 2015-02-18 ENCOUNTER — Ambulatory Visit: Payer: 59 | Admitting: Family Medicine

## 2015-05-25 IMAGING — CR DG CHEST 2V
2 series · 2 of 2 positions shown · non-contrast
Comparison: None available

CLINICAL DATA: Chills, dehydration

EXAM:
CHEST - 2 VIEW

[w chest pa]
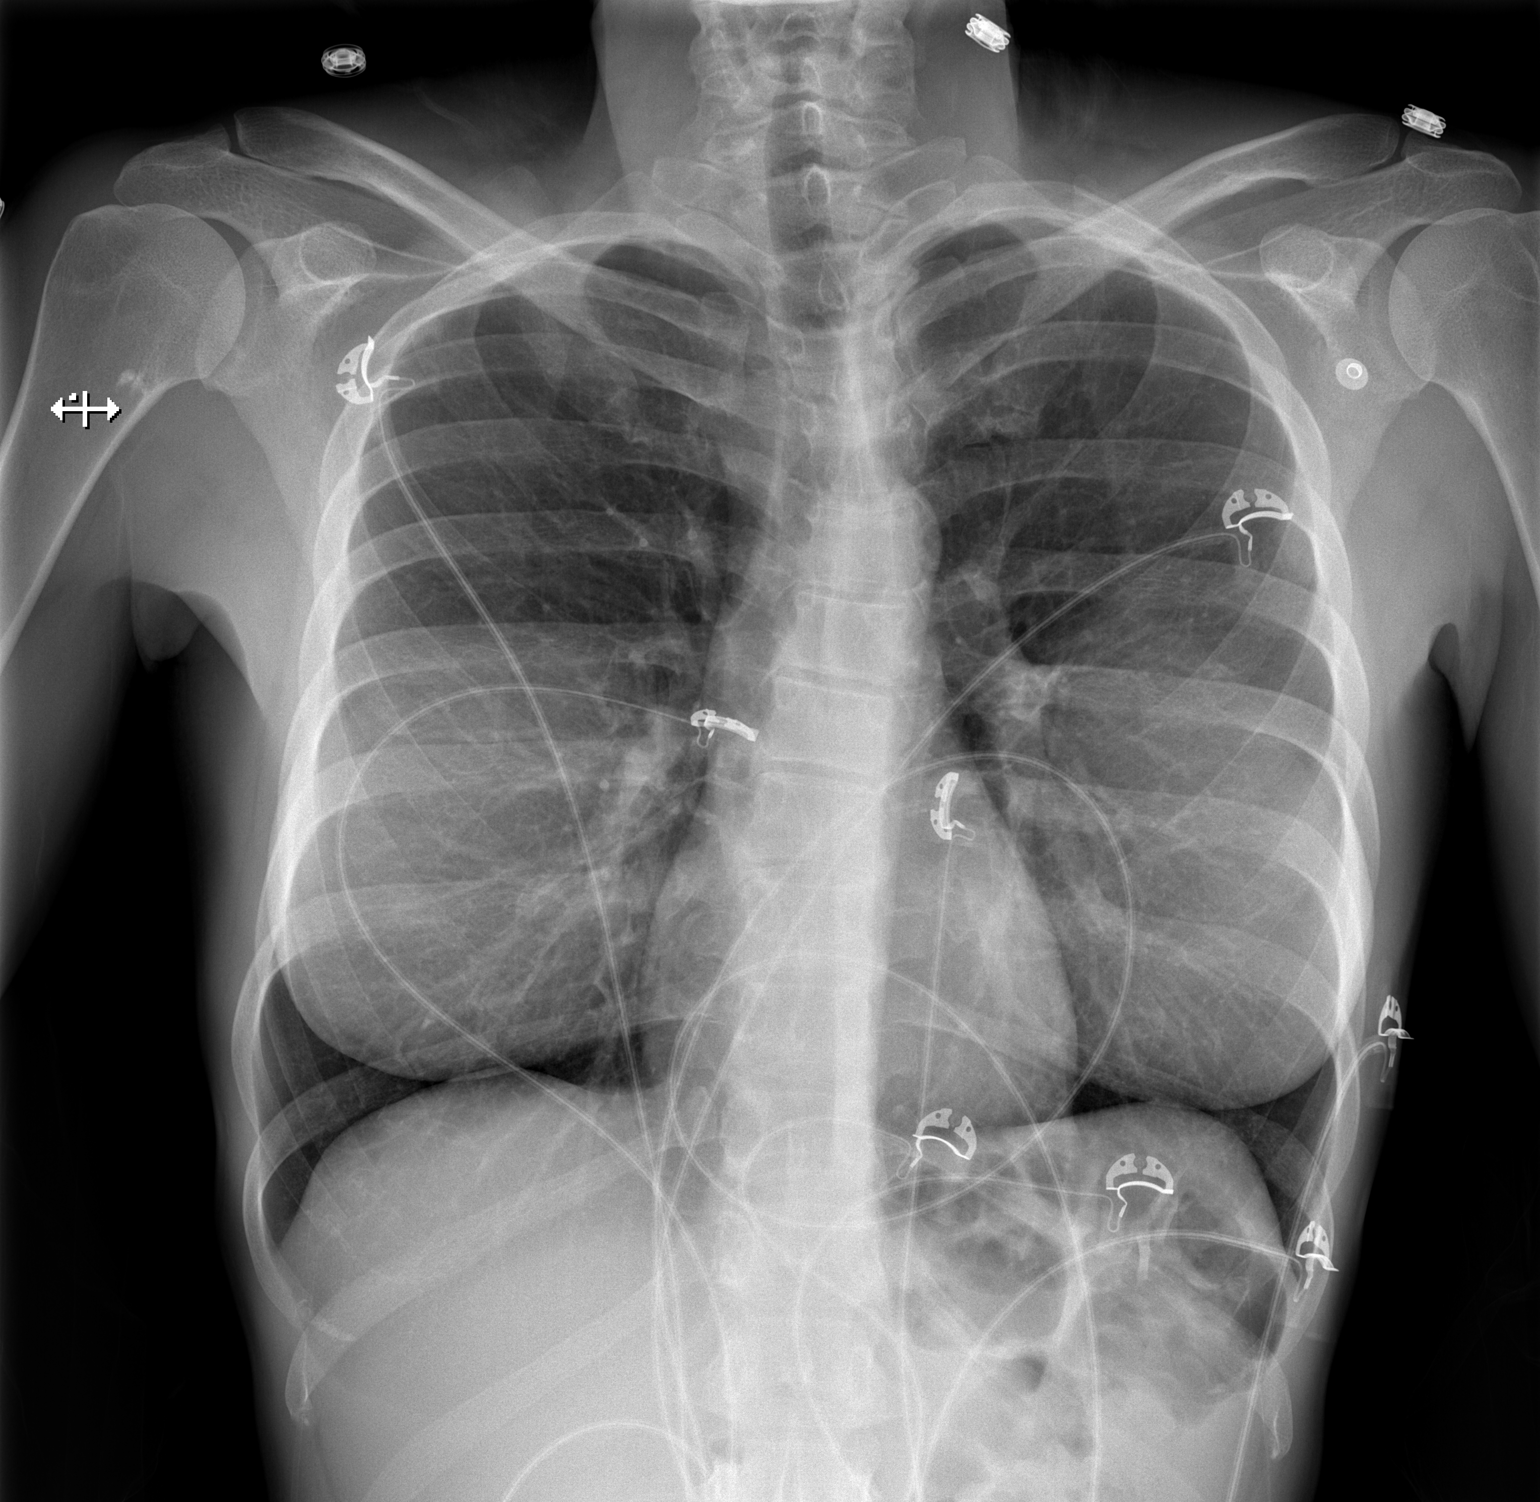

[w chest lat]
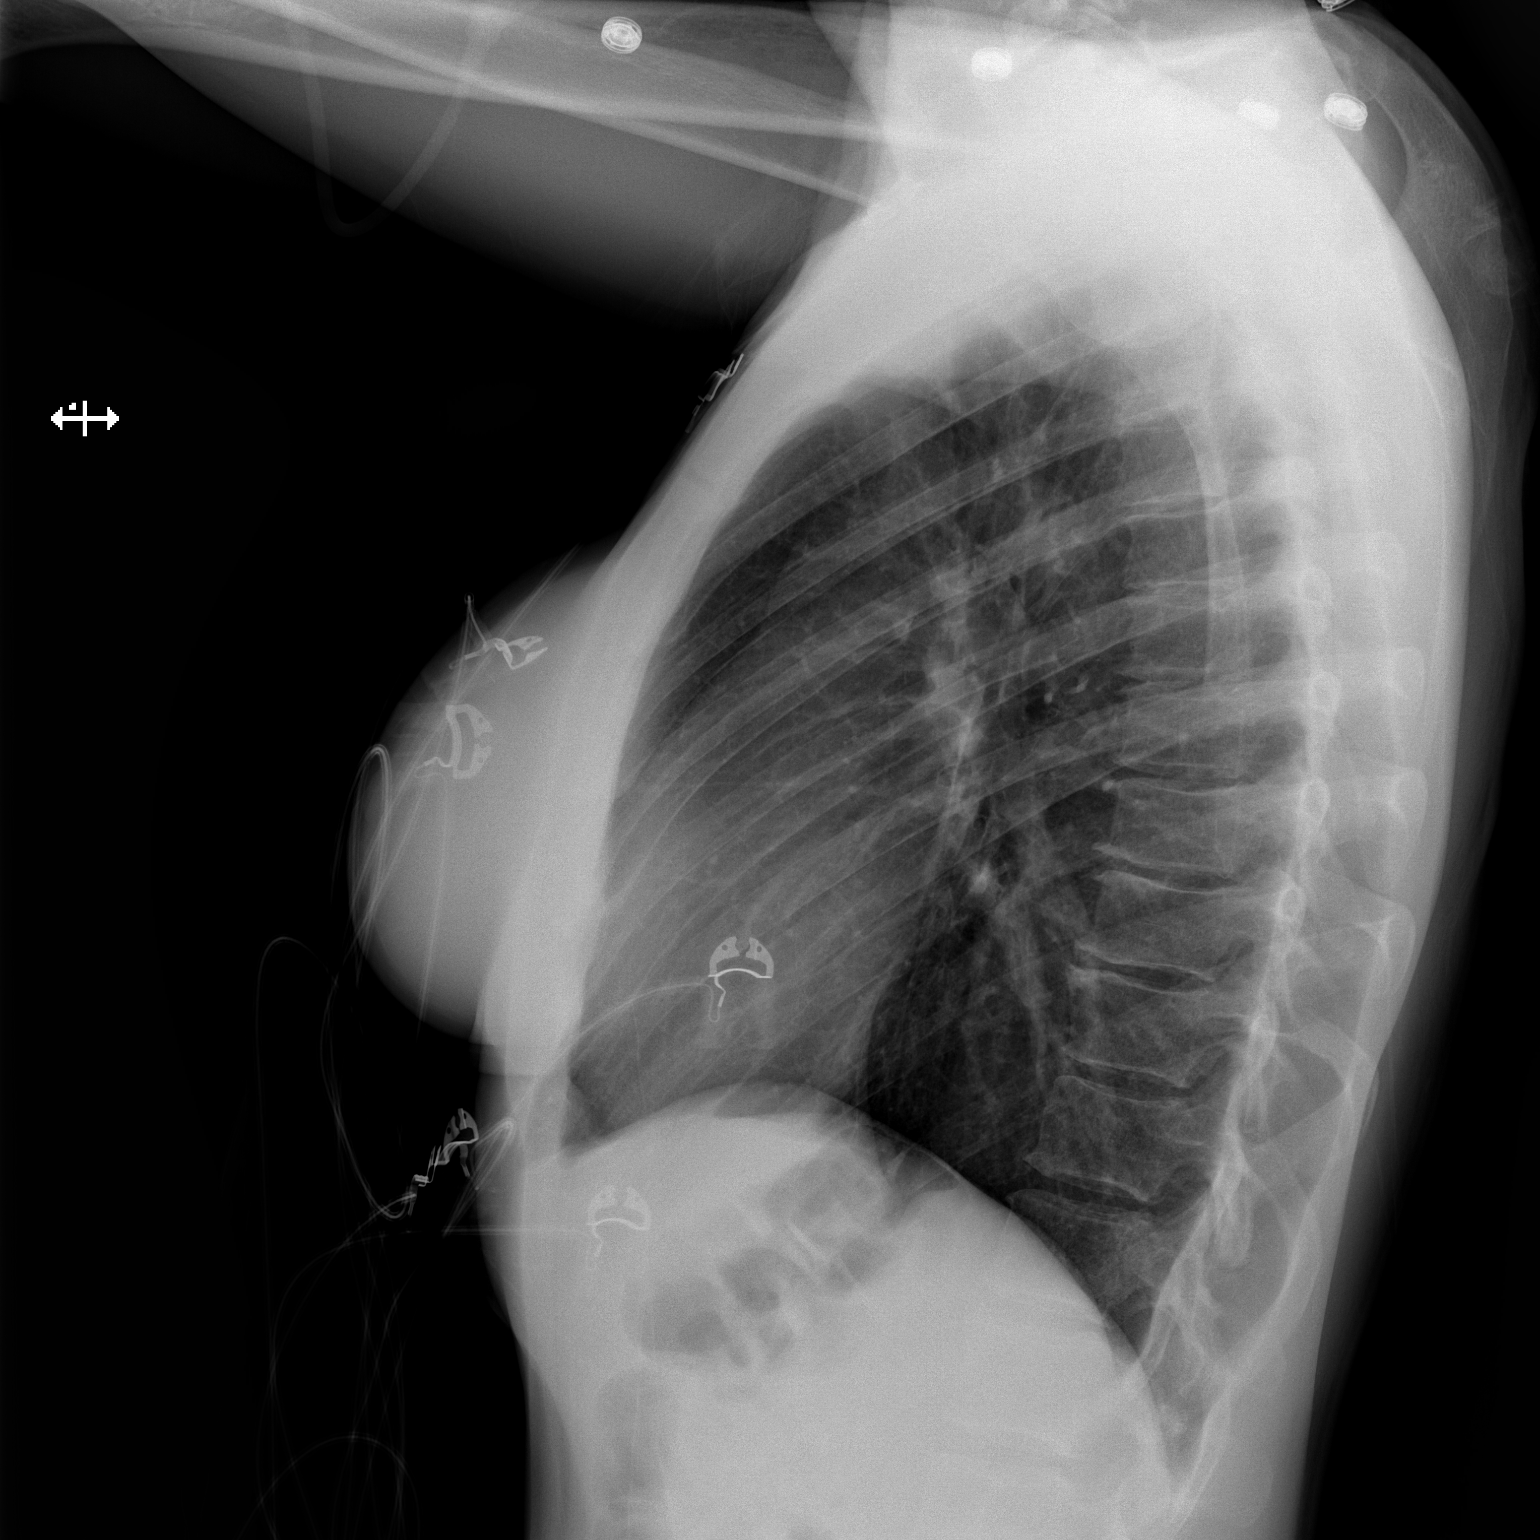

[2 of 2 positions shown; findings below may reference images not displayed]

FINDINGS: The heart size and mediastinal contours are within normal limits.
Both lungs are clear. Mild thoracic levoscoliosis without evident
underlying vertebral anomaly. No effusion.
IMPRESSION: No acute cardiopulmonary disease.

## 2015-09-10 ENCOUNTER — Ambulatory Visit (INDEPENDENT_AMBULATORY_CARE_PROVIDER_SITE_OTHER): Payer: 59

## 2015-09-10 DIAGNOSIS — Z23 Encounter for immunization: Secondary | ICD-10-CM | POA: Diagnosis not present

## 2015-10-26 ENCOUNTER — Encounter: Payer: Self-pay | Admitting: Family Medicine

## 2015-10-26 ENCOUNTER — Ambulatory Visit (INDEPENDENT_AMBULATORY_CARE_PROVIDER_SITE_OTHER): Payer: 59 | Admitting: Family Medicine

## 2015-10-26 VITALS — BP 100/68 | HR 96 | Temp 98.0°F | Resp 16 | Ht 69.0 in | Wt 139.0 lb

## 2015-10-26 DIAGNOSIS — B9789 Other viral agents as the cause of diseases classified elsewhere: Principal | ICD-10-CM

## 2015-10-26 DIAGNOSIS — J069 Acute upper respiratory infection, unspecified: Secondary | ICD-10-CM | POA: Diagnosis not present

## 2015-10-26 NOTE — Patient Instructions (Signed)
Take mucinex DM OTC

## 2015-10-26 NOTE — Progress Notes (Signed)
Pre visit review using our clinic review tool, if applicable. No additional management support is needed unless otherwise documented below in the visit note. 

## 2015-10-26 NOTE — Progress Notes (Signed)
OFFICE NOTE  10/26/2015  CC:  Chief Complaint  Patient presents with  . Sinusitis    x 1 week     HPI: Patient is a 44 y.o. Caucasian female who is here for about 7-8d of nasal congestion, runny nose, ST initially but gone now. Lots of dry cough, makes her head hurt, ears achy too.  Alka seltzer helps her feel a little better.  Husband sick with similar sx's before her.  No wheezing or SOB.  No fevers.  Pertinent PMH:  Past medical, surgical, social, and family history reviewed and no changes are noted since last office visit.  MEDS:  Outpatient Prescriptions Prior to Visit  Medication Sig Dispense Refill  . CALCIUM PO Take 1 tablet by mouth daily.    . carbamazepine (CARBATROL) 300 MG 12 hr capsule Take 300 mg by mouth as directed. Take 1 capsule in the morning and 2 capsules at night    . cholecalciferol (VITAMIN D) 1000 UNITS tablet Take 1,000 Units by mouth daily.    . clonazePAM (KLONOPIN) 1 MG tablet Take 1 mg by mouth at bedtime.    . cycloSPORINE (RESTASIS) 0.05 % ophthalmic emulsion Place 1 drop into both eyes 2 (two) times daily.    Marland Kitchen. lacosamide (VIMPAT) 200 MG TABS Take 200 mg by mouth 2 (two) times daily.    Marland Kitchen. levETIRAcetam (KEPPRA) 1000 MG tablet Take 1,000 mg by mouth 2 (two) times daily.    Marland Kitchen. levonorgestrel (MIRENA) 20 MCG/24HR IUD 1 each by Intrauterine route once.    . Multiple Vitamin (MULTIVITAMIN WITH MINERALS) TABS tablet Take 1 tablet by mouth daily.    Marland Kitchen. erythromycin ophthalmic ointment 1/2 inch ribbon of ointment in each eye qid x 7d (Patient not taking: Reported on 10/26/2015) 3.5 g 1   No facility-administered medications prior to visit.    PE: Blood pressure 100/68, pulse 96, temperature 98 F (36.7 C), temperature source Oral, resp. rate 16, height 5\' 9"  (1.753 m), weight 139 lb (63.05 kg), SpO2 100 %. VS: noted--normal. Gen: alert, NAD, NONTOXIC APPEARING. HEENT: eyes without injection, drainage, or swelling.  Ears: EACs clear, TMs with normal  light reflex and landmarks.  Nose: Clear rhinorrhea, with some dried, crusty exudate adherent to mildly injected mucosa.  No purulent d/c.  No paranasal sinus TTP.  No facial swelling.  Throat and mouth without focal lesion.  No pharyngial swelling, erythema, or exudate.   Neck: supple, no LAD.   LUNGS: CTA bilat, nonlabored resps.   CV: RRR, no m/r/g. EXT: no c/c/e SKIN: no rash   IMPRESSION AND PLAN:  URI with cough. Recommended trial of mucinex DM OTC. Rest, fluids, viral nature of illness discussed.  An After Visit Summary was printed and given to the patient.   FOLLOW UP: prn

## 2016-02-04 ENCOUNTER — Telehealth: Payer: Self-pay | Admitting: Family Medicine

## 2016-02-04 NOTE — Telephone Encounter (Signed)
Patient is Ship broker at [employer]. She states she needs 2nd dose of MMR to continue classes. Can she schedule a nurse visit?

## 2016-02-04 NOTE — Telephone Encounter (Signed)
Nurse visit is fine to schedule for her MMR

## 2016-02-04 NOTE — Telephone Encounter (Signed)
I have updated her chart with immunization from [facility]. She stated that she needs to get this done asap because they have dropped her classes.

## 2016-02-07 NOTE — Telephone Encounter (Signed)
LM for pt to CB to schedule appt °

## 2016-09-15 ENCOUNTER — Ambulatory Visit (INDEPENDENT_AMBULATORY_CARE_PROVIDER_SITE_OTHER): Payer: 59

## 2016-09-15 DIAGNOSIS — Z23 Encounter for immunization: Secondary | ICD-10-CM

## 2017-01-04 DIAGNOSIS — N750 Cyst of Bartholin's gland: Secondary | ICD-10-CM

## 2017-01-04 HISTORY — DX: Cyst of Bartholin's gland: N75.0

## 2017-02-02 ENCOUNTER — Encounter: Payer: Self-pay | Admitting: Family Medicine

## 2017-05-03 ENCOUNTER — Encounter: Payer: Self-pay | Admitting: Family Medicine

## 2017-08-03 ENCOUNTER — Ambulatory Visit: Payer: 59 | Admitting: Family Medicine

## 2017-08-10 ENCOUNTER — Ambulatory Visit: Payer: 59 | Admitting: Family Medicine

## 2017-08-14 ENCOUNTER — Ambulatory Visit (INDEPENDENT_AMBULATORY_CARE_PROVIDER_SITE_OTHER): Payer: 59

## 2017-08-14 DIAGNOSIS — Z23 Encounter for immunization: Secondary | ICD-10-CM

## 2017-09-21 ENCOUNTER — Ambulatory Visit: Payer: 59 | Admitting: Family Medicine

## 2017-11-08 ENCOUNTER — Encounter: Payer: Self-pay | Admitting: Family Medicine

## 2017-11-08 ENCOUNTER — Ambulatory Visit (INDEPENDENT_AMBULATORY_CARE_PROVIDER_SITE_OTHER): Payer: 59 | Admitting: Family Medicine

## 2017-11-08 VITALS — BP 93/59 | HR 74 | Temp 98.0°F | Resp 16 | Ht 69.0 in | Wt 143.0 lb

## 2017-11-08 DIAGNOSIS — B351 Tinea unguium: Secondary | ICD-10-CM

## 2017-11-08 DIAGNOSIS — L853 Xerosis cutis: Secondary | ICD-10-CM

## 2017-11-08 NOTE — Progress Notes (Signed)
OFFICE VISIT  11/08/2017   CC:  Chief Complaint  Patient presents with  . Nail Problem   HPI:    Patient is a 46 y.o. Caucasian female who presents for concern about her toenails/feet. Right 2nd toenail with thickening, white substance under nail.  Had bacterial infection around this same nail ??--13 yrs ago and says it went away with oral tx for fungus + abx (?).  Says bottom of R foot peeling "forever".  No itching.  Left foot does not peel any. OTC antifungal cream has not helped the peeling any.   Past Medical History:  Diagnosis Date  . Contact dermatitis 06/2014   Eyelids:  + mild blepharitis O[facility] (Dr. Cherlynn [place]oBarts)  . Contraceptive management 09/19/2012  . Cyst of Bartholin's gland duct 01/2017   Asymptomatic at GYN--no intervention needed.  . Seizures (HCC) 46 yrs old  . Skin lesion of cheek 09/19/2012  . Vitamin D deficiency 09/19/2012    Past Surgical History:  Procedure Laterality Date  . BREAST ENHANCEMENT SURGERY  46 yrs old  . INTRAUTERINE DEVICE INSERTION     Mirena    Outpatient Medications Prior to Visit  Medication Sig Dispense Refill  . CALCIUM PO Take 1 tablet by mouth daily.    . carbamazepine (CARBATROL) 300 MG 12 hr capsule Take 300 mg by mouth as directed. Take 1 capsule in the morning and 2 capsules at night    . cholecalciferol (VITAMIN D) 1000 UNITS tablet Take 1,000 Units by mouth daily.    . clonazePAM (KLONOPIN) 1 MG tablet Take 1 mg by mouth at bedtime.    . cycloSPORINE (RESTASIS) 0.05 % ophthalmic emulsion Place 1 drop into both eyes 2 (two) times daily.    Marland Kitchen. lacosamide (VIMPAT) 200 MG TABS Take 200 mg by mouth 2 (two) times daily.    Marland Kitchen. levETIRAcetam (KEPPRA) 1000 MG tablet Take 1,000 mg by mouth 2 (two) times daily.    Marland Kitchen. levonorgestrel (MIRENA) 20 MCG/24HR IUD 1 each by Intrauterine route once.    . Multiple Vitamin (MULTIVITAMIN WITH MINERALS) TABS tablet Take 1 tablet by mouth daily.    . Probiotic Product (PROBIOTIC PO) Take 1 capsule by mouth  daily.     No facility-administered medications prior to visit.     No Known Allergies  ROS As per HPI  PE: Blood pressure (!) 93/59, pulse 74, temperature 98 F (36.7 C), temperature source Oral, resp. rate 16, height 5\' 9"  (1.753 m), weight 143 lb (64.9 kg), SpO2 98 %. Gen: Alert, well appearing.  Patient is oriented to person, place, time, and situation. AFFECT: pleasant, lucid thought and speech. Right foot: 2nd toenail with very slight thickening and some white, flaky substance built up under the nail. Minimal similar changes to 3rd toenail.  No erythema or swelling or tenderness around nails.  No ingrowing of nails.   Nails are well groomed. Bottom of R foot with some splotchy non-erythematous superficial flaking/peeling.  No maceration, no moccasin distribution.    LABS:  none  IMPRESSION AND PLAN:  1) Right 2nd and 3rd toenails with mild onychomycosis. Recommended against any oral treatment for this b/c of potential liver toxicity AND interaction with her current anti-epileptic medications.  Emphasized to pt to monitor for ingrowing or bacterial super-infection, but otherwise this is a cosmetic problem only, and the risks of treatment outweigh any benefit from treatment.  2) Dry skin: reassured pt she has no skin fungal infection.  Moisturizer prn recommended.  Spent 15 min with  pt today, with >50% of this time spent in counseling and care coordination regarding the above problems.  An After Visit Summary was printed and given to the patient.  FOLLOW UP: Return for as needed.  Signed:  Santiago BumpersPhil Chike Farrington, MD           11/08/2017
